# Patient Record
Sex: Female | Born: 1955
Health system: Southern US, Community
[De-identification: ages and names within clinical notes are randomized; demographics above are authoritative.]

## PROBLEM LIST (undated history)

## (undated) DIAGNOSIS — I10 Essential (primary) hypertension: Secondary | ICD-10-CM

## (undated) DIAGNOSIS — E7800 Pure hypercholesterolemia, unspecified: Secondary | ICD-10-CM

---

## 1997-06-09 ENCOUNTER — Other Ambulatory Visit: Admission: RE | Admit: 1997-06-09 | Discharge: 1997-06-09 | Payer: Self-pay | Admitting: Obstetrics and Gynecology

## 1998-06-10 ENCOUNTER — Other Ambulatory Visit: Admission: RE | Admit: 1998-06-10 | Discharge: 1998-06-10 | Payer: Self-pay | Admitting: Obstetrics and Gynecology

## 1999-06-28 ENCOUNTER — Other Ambulatory Visit: Admission: RE | Admit: 1999-06-28 | Discharge: 1999-06-28 | Payer: Self-pay | Admitting: Obstetrics and Gynecology

## 2000-08-13 ENCOUNTER — Ambulatory Visit (HOSPITAL_COMMUNITY): Admission: RE | Admit: 2000-08-13 | Discharge: 2000-08-13 | Payer: Self-pay | Admitting: Family Medicine

## 2000-08-13 ENCOUNTER — Encounter: Payer: Self-pay | Admitting: Family Medicine

## 2000-09-05 ENCOUNTER — Other Ambulatory Visit: Admission: RE | Admit: 2000-09-05 | Discharge: 2000-09-05 | Payer: Self-pay | Admitting: Obstetrics and Gynecology

## 2001-11-18 ENCOUNTER — Other Ambulatory Visit: Admission: RE | Admit: 2001-11-18 | Discharge: 2001-11-18 | Payer: Self-pay | Admitting: Obstetrics and Gynecology

## 2002-05-13 ENCOUNTER — Encounter: Payer: Self-pay | Admitting: *Deleted

## 2002-05-13 ENCOUNTER — Ambulatory Visit (HOSPITAL_COMMUNITY): Admission: RE | Admit: 2002-05-13 | Discharge: 2002-05-13 | Payer: Self-pay | Admitting: *Deleted

## 2003-05-21 ENCOUNTER — Other Ambulatory Visit: Admission: RE | Admit: 2003-05-21 | Discharge: 2003-05-21 | Payer: Self-pay | Admitting: Obstetrics and Gynecology

## 2004-09-27 ENCOUNTER — Other Ambulatory Visit: Admission: RE | Admit: 2004-09-27 | Discharge: 2004-09-27 | Payer: Self-pay | Admitting: Obstetrics and Gynecology

## 2016-11-09 DIAGNOSIS — K589 Irritable bowel syndrome without diarrhea: Secondary | ICD-10-CM | POA: Diagnosis not present

## 2016-11-09 DIAGNOSIS — E7800 Pure hypercholesterolemia, unspecified: Secondary | ICD-10-CM | POA: Diagnosis not present

## 2016-11-09 DIAGNOSIS — I10 Essential (primary) hypertension: Secondary | ICD-10-CM | POA: Diagnosis not present

## 2016-11-09 DIAGNOSIS — G4700 Insomnia, unspecified: Secondary | ICD-10-CM | POA: Diagnosis not present

## 2016-11-09 DIAGNOSIS — Z23 Encounter for immunization: Secondary | ICD-10-CM | POA: Diagnosis not present

## 2017-11-21 DIAGNOSIS — E7800 Pure hypercholesterolemia, unspecified: Secondary | ICD-10-CM | POA: Diagnosis not present

## 2017-11-21 DIAGNOSIS — R7303 Prediabetes: Secondary | ICD-10-CM | POA: Diagnosis not present

## 2017-11-21 DIAGNOSIS — I10 Essential (primary) hypertension: Secondary | ICD-10-CM | POA: Diagnosis not present

## 2018-04-02 DIAGNOSIS — L570 Actinic keratosis: Secondary | ICD-10-CM | POA: Diagnosis not present

## 2018-04-02 DIAGNOSIS — L820 Inflamed seborrheic keratosis: Secondary | ICD-10-CM | POA: Diagnosis not present

## 2018-05-29 DIAGNOSIS — E7800 Pure hypercholesterolemia, unspecified: Secondary | ICD-10-CM | POA: Diagnosis not present

## 2018-05-29 DIAGNOSIS — G4700 Insomnia, unspecified: Secondary | ICD-10-CM | POA: Diagnosis not present

## 2018-05-29 DIAGNOSIS — I10 Essential (primary) hypertension: Secondary | ICD-10-CM | POA: Diagnosis not present

## 2018-05-29 DIAGNOSIS — Z0000 Encounter for general adult medical examination without abnormal findings: Secondary | ICD-10-CM | POA: Diagnosis not present

## 2018-05-29 DIAGNOSIS — K589 Irritable bowel syndrome without diarrhea: Secondary | ICD-10-CM | POA: Diagnosis not present

## 2018-06-18 DIAGNOSIS — R7303 Prediabetes: Secondary | ICD-10-CM | POA: Diagnosis not present

## 2018-06-18 DIAGNOSIS — I10 Essential (primary) hypertension: Secondary | ICD-10-CM | POA: Diagnosis not present

## 2018-06-18 DIAGNOSIS — E7800 Pure hypercholesterolemia, unspecified: Secondary | ICD-10-CM | POA: Diagnosis not present

## 2018-07-22 DIAGNOSIS — Z20828 Contact with and (suspected) exposure to other viral communicable diseases: Secondary | ICD-10-CM | POA: Diagnosis not present

## 2018-11-19 DIAGNOSIS — I10 Essential (primary) hypertension: Secondary | ICD-10-CM | POA: Diagnosis not present

## 2018-12-19 ENCOUNTER — Other Ambulatory Visit: Payer: Self-pay

## 2018-12-19 DIAGNOSIS — Z20822 Contact with and (suspected) exposure to covid-19: Secondary | ICD-10-CM

## 2018-12-22 LAB — NOVEL CORONAVIRUS, NAA: SARS-CoV-2, NAA: NOT DETECTED

## 2019-06-11 DIAGNOSIS — G4700 Insomnia, unspecified: Secondary | ICD-10-CM | POA: Diagnosis not present

## 2019-06-11 DIAGNOSIS — R7309 Other abnormal glucose: Secondary | ICD-10-CM | POA: Diagnosis not present

## 2019-06-11 DIAGNOSIS — I10 Essential (primary) hypertension: Secondary | ICD-10-CM | POA: Diagnosis not present

## 2019-06-11 DIAGNOSIS — E7800 Pure hypercholesterolemia, unspecified: Secondary | ICD-10-CM | POA: Diagnosis not present

## 2019-06-11 DIAGNOSIS — K589 Irritable bowel syndrome without diarrhea: Secondary | ICD-10-CM | POA: Diagnosis not present

## 2019-06-11 DIAGNOSIS — Z0000 Encounter for general adult medical examination without abnormal findings: Secondary | ICD-10-CM | POA: Diagnosis not present

## 2019-09-12 DIAGNOSIS — L820 Inflamed seborrheic keratosis: Secondary | ICD-10-CM | POA: Diagnosis not present

## 2019-11-08 ENCOUNTER — Encounter (HOSPITAL_COMMUNITY): Payer: Self-pay | Admitting: *Deleted

## 2019-11-08 ENCOUNTER — Other Ambulatory Visit: Payer: Self-pay

## 2019-11-08 ENCOUNTER — Emergency Department (HOSPITAL_COMMUNITY): Payer: BC Managed Care – PPO

## 2019-11-08 ENCOUNTER — Emergency Department (HOSPITAL_COMMUNITY)
Admission: EM | Admit: 2019-11-08 | Discharge: 2019-11-08 | Disposition: A | Discharge status: Home / Self Care | Payer: BC Managed Care – PPO | Attending: Emergency Medicine | Admitting: Emergency Medicine

## 2019-11-08 DIAGNOSIS — W19XXXA Unspecified fall, initial encounter: Secondary | ICD-10-CM

## 2019-11-08 DIAGNOSIS — S0101XA Laceration without foreign body of scalp, initial encounter: Secondary | ICD-10-CM | POA: Diagnosis not present

## 2019-11-08 DIAGNOSIS — W108XXA Fall (on) (from) other stairs and steps, initial encounter: Secondary | ICD-10-CM | POA: Insufficient documentation

## 2019-11-08 DIAGNOSIS — Y9301 Activity, walking, marching and hiking: Secondary | ICD-10-CM | POA: Insufficient documentation

## 2019-11-08 DIAGNOSIS — S161XXA Strain of muscle, fascia and tendon at neck level, initial encounter: Secondary | ICD-10-CM | POA: Diagnosis not present

## 2019-11-08 DIAGNOSIS — Z043 Encounter for examination and observation following other accident: Secondary | ICD-10-CM | POA: Diagnosis not present

## 2019-11-08 DIAGNOSIS — I10 Essential (primary) hypertension: Secondary | ICD-10-CM | POA: Insufficient documentation

## 2019-11-08 DIAGNOSIS — S0990XA Unspecified injury of head, initial encounter: Secondary | ICD-10-CM | POA: Diagnosis not present

## 2019-11-08 DIAGNOSIS — F10929 Alcohol use, unspecified with intoxication, unspecified: Secondary | ICD-10-CM | POA: Diagnosis not present

## 2019-11-08 HISTORY — DX: Essential (primary) hypertension: I10

## 2019-11-08 HISTORY — DX: Pure hypercholesterolemia, unspecified: E78.00

## 2019-11-08 MED ORDER — LIDOCAINE-EPINEPHRINE 1 %-1:100000 IJ SOLN
20.0000 mL | Freq: Once | INTRAMUSCULAR | Status: AC
  Administered 2019-11-08: 20 mL
  Filled 2019-11-08: qty 1

## 2019-11-08 NOTE — Discharge Instructions (Signed)
Staples are to be removed in 1 week.  Please follow-up with your primary doctor for this.    Return to the emergency department in the meantime if you develop pus draining from the wound, fevers, worsening pain, or other new and concerning symptoms.

## 2019-11-08 NOTE — ED Provider Notes (Signed)
[facility] EMERGENCY DEPARTMENT Provider Note   CSN: [id number] Arrival date & time: 11/08/19  0135     History Chief Complaint  Patient presents with  . Fall    Karen Buck is a 64 y.o. female.  Patient is a 64 year old female with history of hypertension and hyperlipidemia.  She presents today for evaluation of a fall.  Patient fell down a flight of stairs and bumped her head.  She has a laceration to the left parietal region.  Patient was consuming alcohol this evening.  She does not recall why she fell.  She is complaining of pain in the area of the laceration, but denies other injury.  The history is provided by the patient.  Fall This is a new problem. The current episode started less than 1 hour ago. The problem occurs constantly. The problem has not changed since onset.Nothing aggravates the symptoms. Nothing relieves the symptoms. She has tried nothing for the symptoms.       Past Medical History:  Diagnosis Date  . Hypercholesteremia   . Hypertension     There are no problems to display for this patient.   History reviewed. No pertinent surgical history.   OB History   No obstetric history on file.     No family history on file.  Social History   Tobacco Use  . Smoking status: Never Smoker  Substance Use Topics  . Alcohol use: Yes  . Drug use: Never    Home Medications Prior to Admission medications   Not on File    Allergies    Sulfa antibiotics  Review of Systems   Review of Systems  All other systems reviewed and are negative.   Physical Exam Updated Vital Signs BP 130/70   Pulse 94   Temp (!) 96.5 F (35.8 C) (Temporal)   Resp 20   SpO2 99%   Physical Exam Vitals and nursing note reviewed.  Constitutional:      General: She is not in acute distress.    Appearance: She is well-developed. She is not diaphoretic.  HENT:     Head: Normocephalic and atraumatic.     Comments: There is an 8 cm laceration to  the right parietal region.  Bleeding is controlled. Cardiovascular:     Rate and Rhythm: Normal rate and regular rhythm.     Heart sounds: No murmur heard.  No friction rub. No gallop.   Pulmonary:     Effort: Pulmonary effort is normal. No respiratory distress.     Breath sounds: Normal breath sounds. No wheezing.  Abdominal:     General: Bowel sounds are normal. There is no distension.     Palpations: Abdomen is soft.     Tenderness: There is no abdominal tenderness.  Musculoskeletal:        General: Normal range of motion.     Cervical back: Normal range of motion and neck supple.  Skin:    General: Skin is warm and dry.  Neurological:     General: No focal deficit present.     Mental Status: She is alert and oriented to person, place, and time.     Cranial Nerves: No cranial nerve deficit.     Sensory: No sensory deficit.     Motor: No weakness.     Coordination: Coordination normal.     ED Results / Procedures / Treatments   Labs (all labs ordered are listed, but only abnormal results are displayed) Labs Reviewed -  No data to display  EKG None  Radiology No results found.  Procedures Procedures (including critical care time)  Medications Ordered in ED Medications  lidocaine-EPINEPHrine (XYLOCAINE W/EPI) 2 %-1:100000 (with pres) injection 20 mL (has no administration in time range)    ED Course  I have reviewed the triage vital signs and the nursing notes.  Pertinent labs & imaging results that were available during my care of the patient were reviewed by me and considered in my medical decision making (see chart for details).    MDM Rules/Calculators/A&P  Patient CT of head and neck unremarkable.  She is neurologically intact and has no other complaints.  The laceration was repaired as below.  Patient to be discharged with local wound care and staple removal in 1 week.  Her cervical spine was cleared radiographically and clinically.  LACERATION  REPAIR Performed by: Geoffery Lyons Authorized by: Geoffery Lyons Consent: Verbal consent obtained. Risks and benefits: risks, benefits and alternatives were discussed Consent given by: patient Patient identity confirmed: provided demographic data Prepped and Draped in normal sterile fashion Wound explored  Laceration Location: left parietal scalp  Laceration Length: 10cm  No Foreign Bodies seen or palpated  Anesthesia: local infiltration  Local anesthetic: lidocaine 2% with epinephrine  Anesthetic total: 10 ml  Irrigation method: syringe Amount of cleaning: standard  Skin closure: staples  Number of sutures: 9  Technique: staples  Patient tolerance: Patient tolerated the procedure well with no immediate complications.   Final Clinical Impression(s) / ED Diagnoses Final diagnoses:  None    Rx / DC Orders ED Discharge Orders    None       Geoffery Lyons, MD 11/08/19 [phone]

## 2019-11-08 NOTE — ED Triage Notes (Signed)
Pt from home by EMS after falling down ~10 steps. Pt was walking up her steps and fell backward, unsure what caused fall. Admits to ETOH tonight. Large lac and hematoma to L side of her head, bleeding controlled on arrival

## 2019-11-08 NOTE — ED Notes (Signed)
E-signature pad unavailable at time of pt discharge. This RN discussed discharge materials with pt and answered all pt questions. Pt stated understanding of discharge material. ? ?

## 2019-11-10 DIAGNOSIS — S0990XA Unspecified injury of head, initial encounter: Secondary | ICD-10-CM | POA: Diagnosis not present

## 2019-11-14 DIAGNOSIS — R000 Tachycardia, unspecified: Secondary | ICD-10-CM | POA: Diagnosis not present

## 2019-11-14 DIAGNOSIS — R42 Dizziness and giddiness: Secondary | ICD-10-CM | POA: Diagnosis not present

## 2019-11-14 DIAGNOSIS — S0990XD Unspecified injury of head, subsequent encounter: Secondary | ICD-10-CM | POA: Diagnosis not present

## 2019-12-03 DIAGNOSIS — G4700 Insomnia, unspecified: Secondary | ICD-10-CM | POA: Diagnosis not present

## 2019-12-03 DIAGNOSIS — K589 Irritable bowel syndrome without diarrhea: Secondary | ICD-10-CM | POA: Diagnosis not present

## 2019-12-03 DIAGNOSIS — I10 Essential (primary) hypertension: Secondary | ICD-10-CM | POA: Diagnosis not present

## 2019-12-03 DIAGNOSIS — E7800 Pure hypercholesterolemia, unspecified: Secondary | ICD-10-CM | POA: Diagnosis not present

## 2020-06-15 DIAGNOSIS — Z0000 Encounter for general adult medical examination without abnormal findings: Secondary | ICD-10-CM | POA: Diagnosis not present

## 2020-06-15 DIAGNOSIS — K589 Irritable bowel syndrome without diarrhea: Secondary | ICD-10-CM | POA: Diagnosis not present

## 2020-06-15 DIAGNOSIS — E7800 Pure hypercholesterolemia, unspecified: Secondary | ICD-10-CM | POA: Diagnosis not present

## 2020-06-15 DIAGNOSIS — I10 Essential (primary) hypertension: Secondary | ICD-10-CM | POA: Diagnosis not present

## 2020-06-15 DIAGNOSIS — G4700 Insomnia, unspecified: Secondary | ICD-10-CM | POA: Diagnosis not present

## 2020-06-15 DIAGNOSIS — R7303 Prediabetes: Secondary | ICD-10-CM | POA: Diagnosis not present

## 2020-12-15 DIAGNOSIS — R7303 Prediabetes: Secondary | ICD-10-CM | POA: Diagnosis not present

## 2020-12-15 DIAGNOSIS — I10 Essential (primary) hypertension: Secondary | ICD-10-CM | POA: Diagnosis not present

## 2020-12-15 DIAGNOSIS — E7800 Pure hypercholesterolemia, unspecified: Secondary | ICD-10-CM | POA: Diagnosis not present

## 2020-12-15 DIAGNOSIS — F419 Anxiety disorder, unspecified: Secondary | ICD-10-CM | POA: Diagnosis not present

## 2020-12-15 DIAGNOSIS — G4700 Insomnia, unspecified: Secondary | ICD-10-CM | POA: Diagnosis not present

## 2022-01-13 IMAGING — CT CT HEAD W/O CM
4 series · 15 of 47 positions shown, 17 images · non-contrast
Comparison: None.

CLINICAL DATA: Fall down stairs

EXAM:
CT HEAD WITHOUT CONTRAST
TECHNIQUE: Contiguous axial images were obtained from the base of the skull
through the vertex without intravenous contrast.

[Series 3: head wo · axial · 0.41mm/px · z∈[-70,+60]mm · 7 of 36 slices shown, 9 images]
[im 5/36  brain]
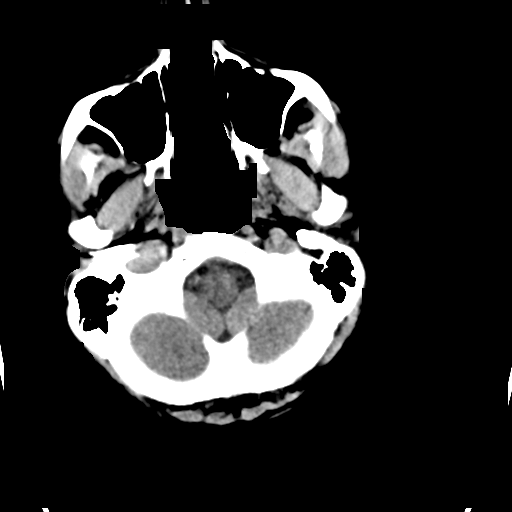
[im 5/36  bone]
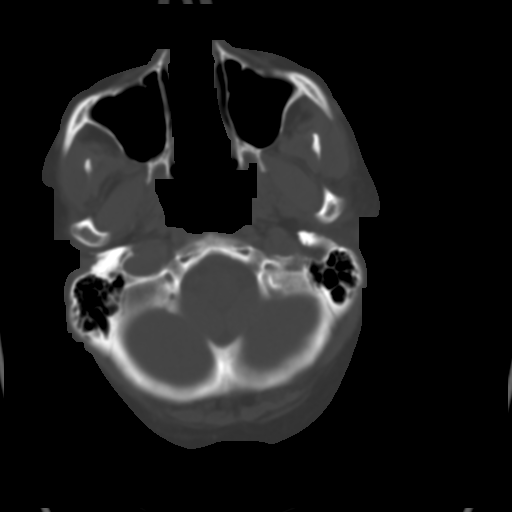
[im 9/36  brain]
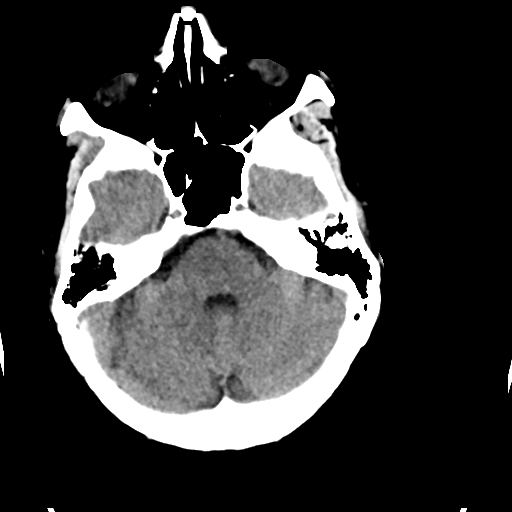
[im 14/36  brain]
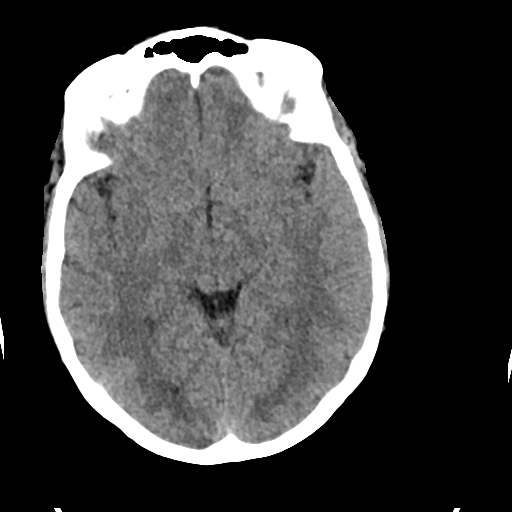
[im 18/36  brain]
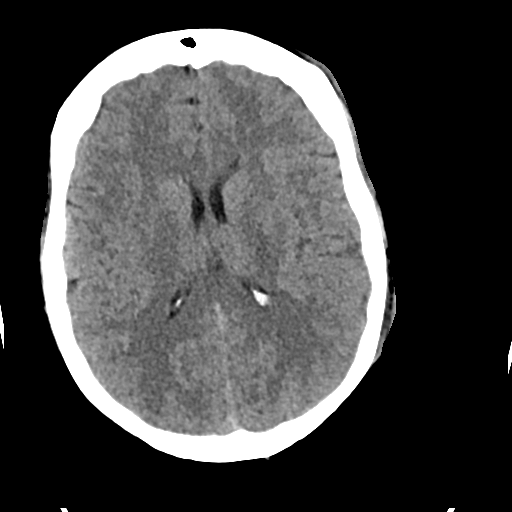
[im 22/36  brain]
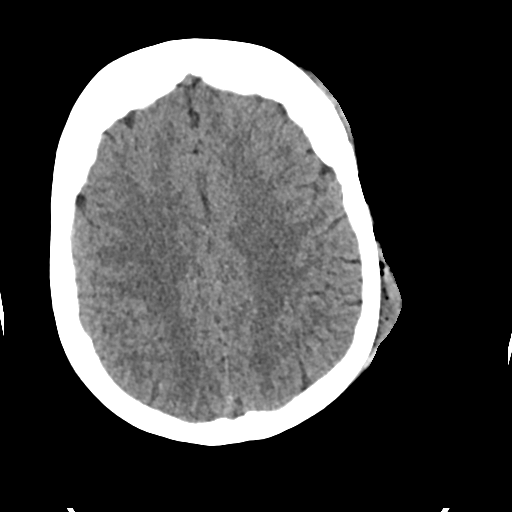
[im 22/36  bone]
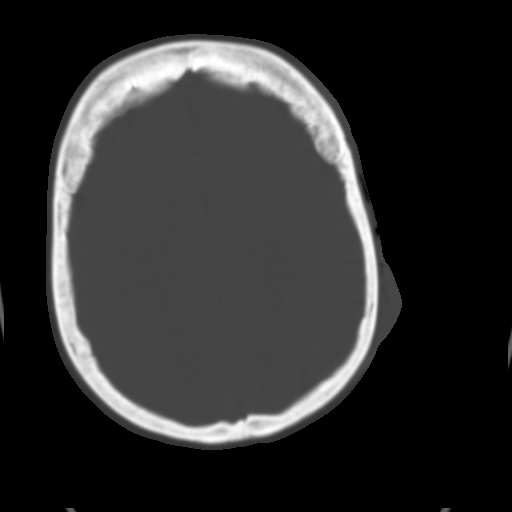
[im 27/36  brain]
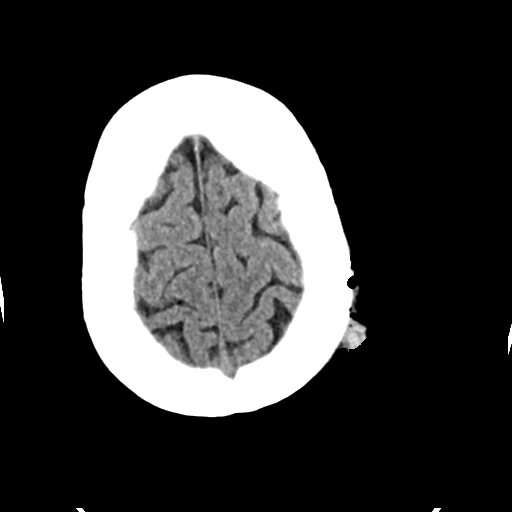
[im 31/36  brain]
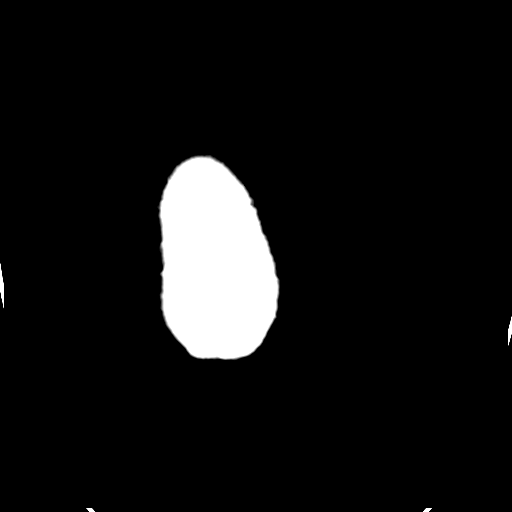

[Series 4: head bone · axial · 0.41mm/px · z∈[-74,-56]mm · 2 of 88 slices shown]
[im 9/88  bone]
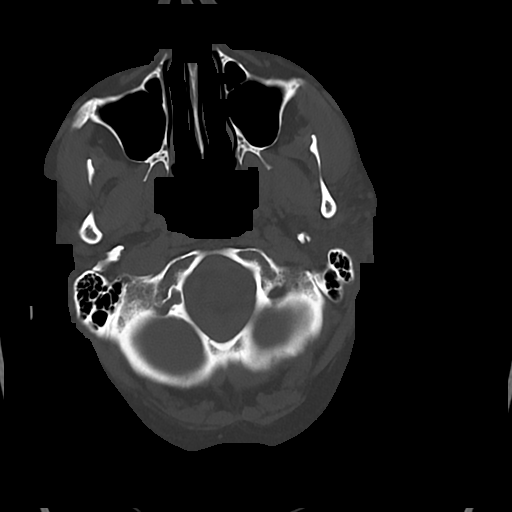
[im 18/88  bone]
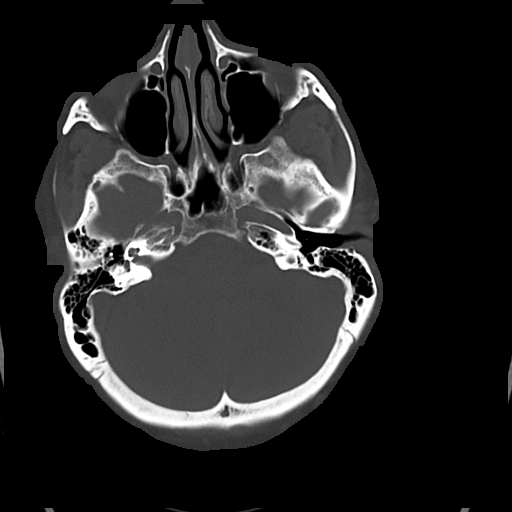

[Series 5: cor soft · coronal · 0.41mm/px · 3 of 82 slices shown]
[im 28/82  brain]
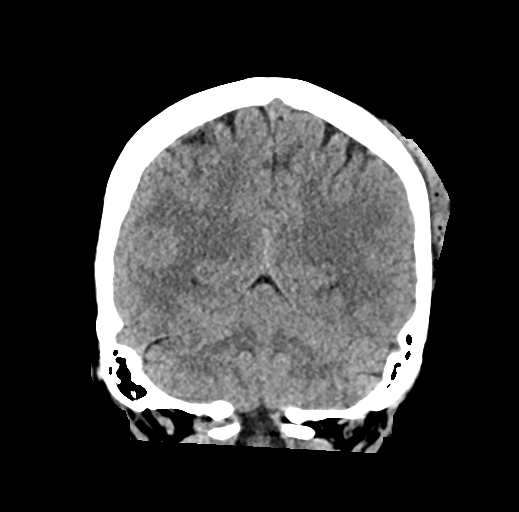
[im 37/82  brain]
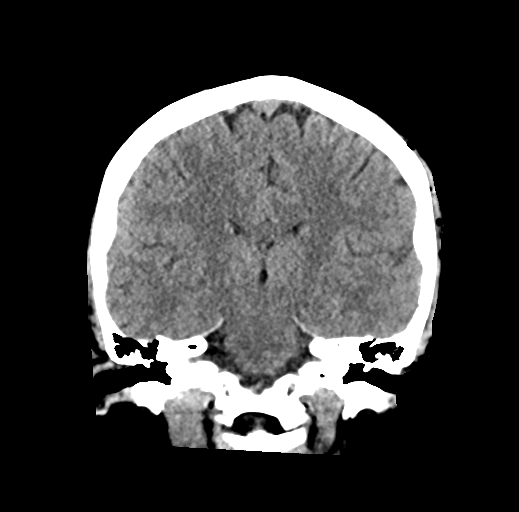
[im 46/82  brain]
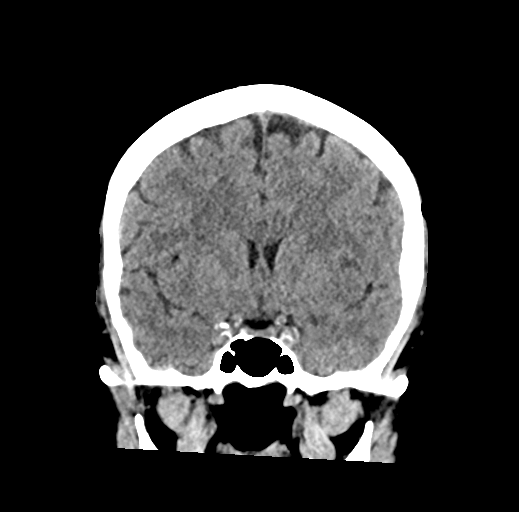

[Series 6: sag soft · sagittal · 0.40mm/px · 3 of 60 slices shown]
[im 20/60  brain]
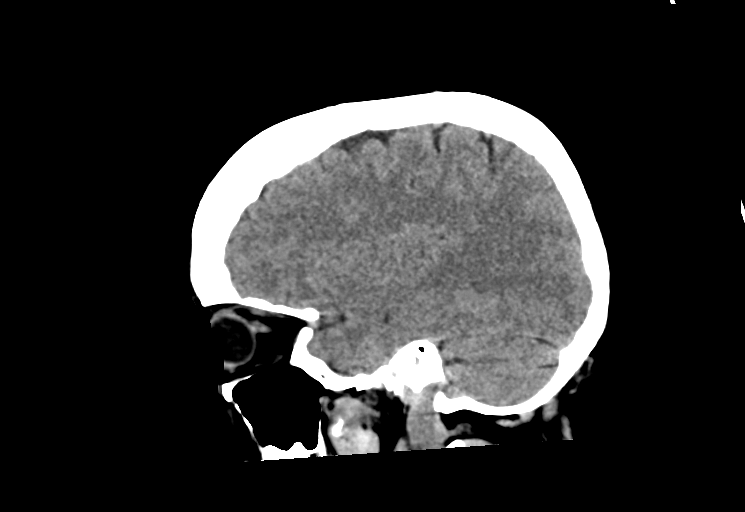
[im 30/60  brain]
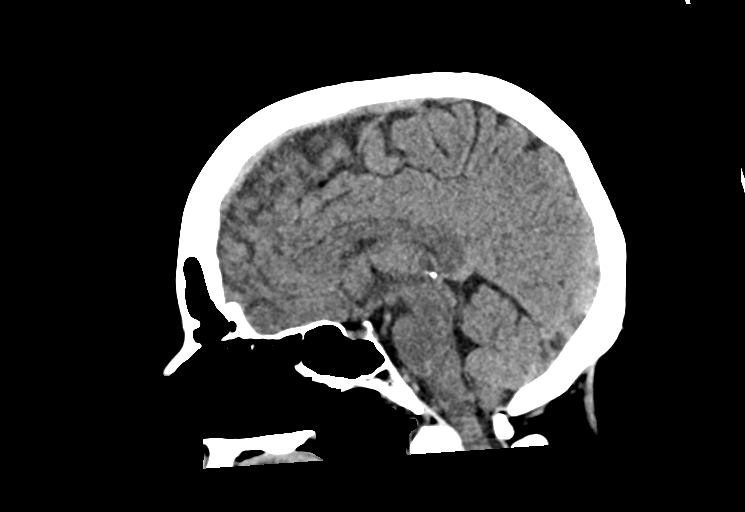
[im 40/60  brain]
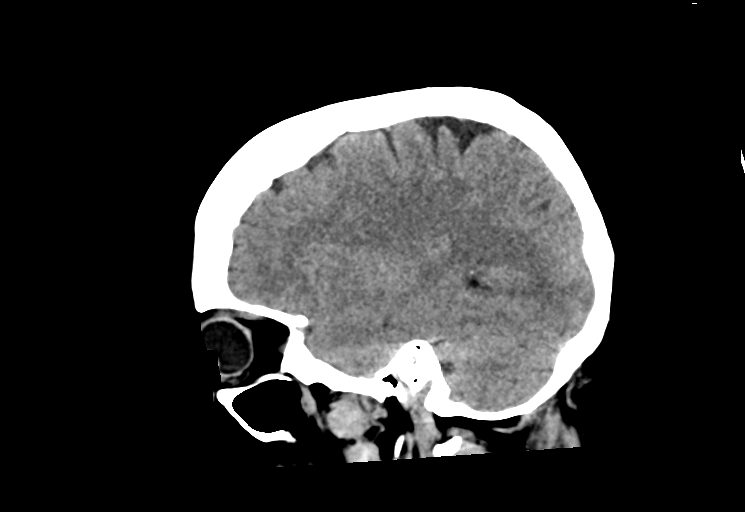

[15 of 47 positions shown; findings below may reference images not displayed]

FINDINGS: Brain: No evidence of acute territorial infarction, hemorrhage,
hydrocephalus,extra-axial collection or mass lesion/mass effect.
Normal gray-white differentiation. Ventricles are normal in size and
contour.

Vascular: No hyperdense vessel or unexpected calcification.

Skull: The skull is intact. No fracture or focal lesion identified.

Sinuses/Orbits: The visualized paranasal sinuses and mastoid air
cells are clear. The orbits and globes intact.

Other: Small soft tissue hematoma seen overlying the left frontal
skull. There is also a laceration with subcutaneous emphysema along
the posterior right parietal skull.

Cervical spine:

Alignment: There is straightening of the normal cervical lordosis.

Skull base and vertebrae: Visualized skull base is intact. No
atlanto-occipital dissociation. The vertebral body heights are well
maintained. No fracture or pathologic osseous lesion seen.

Soft tissues and spinal canal: The visualized paraspinal soft
tissues are unremarkable. No prevertebral soft tissue swelling is
seen. The spinal canal is grossly unremarkable, no large epidural
collection or significant canal narrowing.

Disc levels: Mild disc height loss and disc osteophyte complex are
most notable at C5-C6 with mild-to-moderate neural foraminal
narrowing and mild central canal effacement.

Upper chest: The lung apices are clear. Thoracic inlet is within
normal limits.

Other: None
IMPRESSION: No acute intracranial abnormality.

Soft tissue hematomas overlying the left skull.

No acute fracture or malalignment of the spine.
